# Patient Record
Sex: Female | Born: 1965 | Hispanic: Yes | Marital: Married | Smoking: Never smoker
Health system: Southern US, Community
[De-identification: ages and names within clinical notes are randomized; demographics above are authoritative.]

## PROBLEM LIST (undated history)

## (undated) DIAGNOSIS — I10 Essential (primary) hypertension: Secondary | ICD-10-CM

## (undated) DIAGNOSIS — E119 Type 2 diabetes mellitus without complications: Secondary | ICD-10-CM

---

## 2012-12-14 ENCOUNTER — Emergency Department: Payer: Self-pay | Admitting: Emergency Medicine

## 2012-12-16 ENCOUNTER — Emergency Department: Payer: Self-pay | Admitting: Emergency Medicine

## 2012-12-16 LAB — CBC
HCT: 38.8 % (ref 35.0–47.0)
HGB: 13.5 g/dL (ref 12.0–16.0)
MCH: 31.1 pg (ref 26.0–34.0)
MCV: 89 fL (ref 80–100)
RBC: 4.35 10*6/uL (ref 3.80–5.20)
RDW: 12.3 % (ref 11.5–14.5)
WBC: 8.2 10*3/uL (ref 3.6–11.0)

## 2013-02-06 ENCOUNTER — Ambulatory Visit: Payer: Self-pay

## 2013-03-06 ENCOUNTER — Ambulatory Visit: Payer: Self-pay

## 2015-01-15 ENCOUNTER — Emergency Department: Payer: Self-pay | Admitting: Emergency Medicine

## 2017-01-18 ENCOUNTER — Ambulatory Visit: Payer: Self-pay | Attending: Oncology

## 2017-12-28 ENCOUNTER — Ambulatory Visit: Admission: RE | Admit: 2017-12-28 | Payer: Medicaid Other | Source: Ambulatory Visit | Admitting: *Deleted

## 2018-01-03 ENCOUNTER — Ambulatory Visit: Payer: Self-pay

## 2018-01-03 ENCOUNTER — Ambulatory Visit
Admission: RE | Admit: 2018-01-03 | Discharge: 2018-01-03 | Disposition: A | Discharge status: Home / Self Care | Payer: Self-pay | Source: Ambulatory Visit | Attending: Oncology | Admitting: Oncology

## 2018-01-03 ENCOUNTER — Ambulatory Visit: Payer: Medicaid Other | Attending: Oncology | Admitting: *Deleted

## 2018-01-03 VITALS — BP 145/91 | HR 94 | Temp 98.1°F | Ht 61.0 in | Wt 142.0 lb

## 2018-01-03 DIAGNOSIS — Z0000 Encounter for general adult medical examination without abnormal findings: Secondary | ICD-10-CM

## 2018-01-03 NOTE — Progress Notes (Signed)
Subjective:     Patient ID: Amanda RungAna Del Carmen Savage, female   DOB: 02/25/1966, 52 y.o.   MRN: [id number]  HPI   Review of Systems     Objective:   Physical Exam  Pulmonary/Chest: Right breast exhibits no inverted nipple, no mass, no nipple discharge, no skin change and no tenderness. Left breast exhibits no inverted nipple, no mass, no nipple discharge, no skin change and no tenderness. Breasts are symmetrical.       Assessment:     52 year old Hispanic female referred to [facility] by [facility] for clinical breast exam and mammogram only.  Amanda Savage, the interpreter present during the interview and exam.  Patient also has her daughter present.  Clinical breast exam unremarkable.  Taught self breast awareness.  Last pap on 10/26/17 was negative without HPV co-testing.  Next pap due in 2023.  Patient has been screened for eligibility.  She does not have any insurance, Medicare or regular Medicaid.  She does have family planning Medicaid.  She also meets financial eligibility.  Hand-out given on the Affordable Care Act.    Plan:     Screening mammogram ordered.  Will follow-up per [facility] protocol.

## 2018-01-03 NOTE — Patient Instructions (Signed)
Gave patient hand-out, Women Staying Healthy, Active and Well from [facility], with education on breast health, pap smears, heart and colon health. 

## 2018-01-15 ENCOUNTER — Encounter: Payer: Self-pay | Admitting: *Deleted

## 2018-01-15 NOTE — Progress Notes (Signed)
Letter mailed from the [facility] to inform patient of her normal mammogram results.  Patient is to follow-up with annual screening in one year.  HSIS to Christy. 

## 2020-01-18 ENCOUNTER — Ambulatory Visit: Payer: Self-pay | Attending: Internal Medicine

## 2020-01-18 DIAGNOSIS — Z23 Encounter for immunization: Secondary | ICD-10-CM

## 2020-01-18 NOTE — Progress Notes (Signed)
   Covid-19 Vaccination Clinic  Name:  Magali Bray    MRN: [id number] DOB: April 10, 1966  01/18/2020  Ms. Coombs was observed post Covid-19 immunization for 15 minutes without incident. She was provided with Vaccine Information Sheet and instruction to access the V-Safe system.   Ms. Buffalo was instructed to call 911 with any severe reactions post vaccine: Marland Kitchen Difficulty breathing  . Swelling of face and throat  . A fast heartbeat  . A bad rash all over body  . Dizziness and weakness   Immunizations Administered    Name Date Dose VIS Date Route   Pfizer COVID-19 Vaccine 01/18/2020 12:56 PM 0.3 mL 10/11/2019 Intramuscular   Manufacturer: [employer], [employer]   Lot: [id number]   NDC: [address]-4

## 2020-02-08 ENCOUNTER — Ambulatory Visit: Payer: Self-pay | Attending: Internal Medicine

## 2020-02-08 DIAGNOSIS — Z23 Encounter for immunization: Secondary | ICD-10-CM

## 2020-02-08 NOTE — Progress Notes (Signed)
   Covid-19 Vaccination Clinic  Name:  Amanda Savage    MRN: [id number] DOB: [birth year]  02/08/2020  [name] was observed post Covid-19 immunization for 15 minutes without incident. She was provided with Vaccine Information Sheet and instruction to access the V-Safe system.   [name] was instructed to call 911 with any severe reactions post vaccine: Marland Kitchen Difficulty breathing  . Swelling of face and throat  . A fast heartbeat  . A bad rash all over body  . Dizziness and weakness   Immunizations Administered    Name Date Dose VIS Date Route   Pfizer COVID-19 Vaccine 02/08/2020 12:07 PM 0.3 mL 10/11/2019 Intramuscular   Manufacturer: [employer], [employer]   Lot: [id number]   NDC: [address]-2

## 2020-08-19 ENCOUNTER — Ambulatory Visit (LOCAL_COMMUNITY_HEALTH_CENTER): Payer: Self-pay

## 2020-08-19 ENCOUNTER — Other Ambulatory Visit: Payer: Self-pay

## 2020-08-19 DIAGNOSIS — Z23 Encounter for immunization: Secondary | ICD-10-CM

## 2020-08-19 NOTE — Progress Notes (Signed)
Pt to clinic for vaccines, accepted Tdap, influenza, MMR, and Twinrix today. Pt to check with her PCP or immigration doctor about Varicella vaccine and taking ASA. Pt counseled that if Varicella vaccine is required or recommended by doctor, then she must wait at least 4 weeks before getting the vaccine since she had MMR today.

## 2021-12-13 ENCOUNTER — Emergency Department
Admission: EM | Admit: 2021-12-13 | Discharge: 2021-12-13 | Disposition: A | Discharge status: Home / Self Care | Payer: Self-pay | Attending: Emergency Medicine | Admitting: Emergency Medicine

## 2021-12-13 ENCOUNTER — Other Ambulatory Visit: Payer: Self-pay

## 2021-12-13 DIAGNOSIS — I10 Essential (primary) hypertension: Secondary | ICD-10-CM | POA: Insufficient documentation

## 2021-12-13 DIAGNOSIS — E119 Type 2 diabetes mellitus without complications: Secondary | ICD-10-CM | POA: Insufficient documentation

## 2021-12-13 DIAGNOSIS — R439 Unspecified disturbances of smell and taste: Secondary | ICD-10-CM | POA: Insufficient documentation

## 2021-12-13 DIAGNOSIS — R432 Parageusia: Secondary | ICD-10-CM

## 2021-12-13 HISTORY — DX: Essential (primary) hypertension: I10

## 2021-12-13 HISTORY — DX: Type 2 diabetes mellitus without complications: E11.9

## 2021-12-13 LAB — URINALYSIS, COMPLETE (UACMP) WITH MICROSCOPIC
Bilirubin Urine: NEGATIVE
Glucose, UA: 150 mg/dL — AB
Hgb urine dipstick: NEGATIVE
Ketones, ur: NEGATIVE mg/dL
Nitrite: NEGATIVE
Protein, ur: NEGATIVE mg/dL
Specific Gravity, Urine: 1.004 — ABNORMAL LOW (ref 1.005–1.030)
pH: 6 (ref 5.0–8.0)

## 2021-12-13 LAB — BASIC METABOLIC PANEL
Anion gap: 10 (ref 5–15)
BUN: 16 mg/dL (ref 6–20)
CO2: 25 mmol/L (ref 22–32)
Calcium: 9.5 mg/dL (ref 8.9–10.3)
Chloride: 96 mmol/L — ABNORMAL LOW (ref 98–111)
Creatinine, Ser: 0.34 mg/dL — ABNORMAL LOW (ref 0.44–1.00)
GFR, Estimated: >60 mL/min (ref >60)
Glucose, Bld: 292 mg/dL — ABNORMAL HIGH (ref 70–99)
Potassium: 3.6 mmol/L (ref 3.5–5.1)
Sodium: 131 mmol/L — ABNORMAL LOW (ref 135–145)

## 2021-12-13 LAB — CBC
HCT: 39.2 % (ref 36.0–46.0)
Hemoglobin: 13.5 g/dL (ref 12.0–15.0)
MCH: 30.7 pg (ref 26.0–34.0)
MCHC: 34.4 g/dL (ref 30.0–36.0)
MCV: 89.1 fL (ref 80.0–100.0)
Platelets: 349 10*3/uL (ref 150–400)
RBC: 4.4 MIL/uL (ref 3.87–5.11)
RDW: 11.8 % (ref 11.5–15.5)
WBC: 7 10*3/uL (ref 4.0–10.5)
nRBC: 0 % (ref 0.0–0.2)

## 2021-12-13 LAB — CBG MONITORING, ED: Glucose-Capillary: 263 mg/dL — ABNORMAL HIGH (ref 70–99)

## 2021-12-13 NOTE — Discharge Instructions (Signed)
I am not exactly sure why you are is altered.  Please follow-up with your primary care provider.  If you develop any new neurologic symptoms such as difficulty seeing, numbness or weakness, please return to the emergency department.

## 2021-12-13 NOTE — ED Provider Notes (Signed)
[facility] Provider Note    Event Date/Time   First MD Initiated Contact with Patient 12/13/21 1708     (approximate)   History   altered taste   HPI  Amanda Savage is a 56 y.o. female with past medical history of diabetes and hypertension who presents with altered taste.  Patient notes that for the last week everything that she eats taste sweet including water.  She tried to see her primary care doctor today but was unable to get in.  She denies any headache visual change difficulty speaking numbness tingling or weakness.  No history of similar.  Sense of smell is normal.  No congestion fevers.    Past Medical History:  Diagnosis Date   Diabetes mellitus without complication (HCC)    Hypertension     There are no problems to display for this patient.    Physical Exam  Triage Vital Signs: ED Triage Vitals  Enc Vitals Group     BP 12/13/21 1641 (!) 145/104     Pulse Rate 12/13/21 1641 96     Resp 12/13/21 1641 20     Temp 12/13/21 1641 98.2 F (36.8 C)     Temp Source 12/13/21 1641 Oral     SpO2 12/13/21 1641 98 %     Weight 12/13/21 1652 170 lb (77.1 kg)     Height 12/13/21 1652 5\' 1"  (1.549 m)     Head Circumference --      Peak Flow --      Pain Score 12/13/21 1652 0     Pain Loc --      Pain Edu? --      Excl. in GC? --     Most recent vital signs: Vitals:   12/13/21 1641  BP: (!) 145/104  Pulse: 96  Resp: 20  Temp: 98.2 F (36.8 C)  SpO2: 98%     General: Awake, no distress.  CV:  Good peripheral perfusion.  Resp:  Normal effort.  Abd:  No distention.  Neuro:             Awake, Alert, Oriented x 3  Other:  Aox3, nml speech  PERRL, EOMI, face symmetric, nml tongue movement  5/5 strength in the BL upper and lower extremities  Sensation grossly intact in the BL upper and lower extremities  Finger-nose-finger intact BL    ED Results / Procedures / Treatments  Labs (all labs ordered are listed, but only  abnormal results are displayed) Labs Reviewed  BASIC METABOLIC PANEL - Abnormal; Notable for the following components:      Result Value   Sodium 131 (*)    Chloride 96 (*)    Glucose, Bld 292 (*)    Creatinine, Ser 0.34 (*)    All other components within normal limits  URINALYSIS, COMPLETE (UACMP) WITH MICROSCOPIC - Abnormal; Notable for the following components:   Color, Urine COLORLESS (*)    APPearance CLEAR (*)    Specific Gravity, Urine 1.004 (*)    Glucose, UA 150 (*)    Leukocytes,Ua SMALL (*)    Bacteria, UA RARE (*)    All other components within normal limits  CBG MONITORING, ED - Abnormal; Notable for the following components:   Glucose-Capillary 263 (*)    All other components within normal limits  CBC     EKG     RADIOLOGY    PROCEDURES:    MEDICATIONS ORDERED IN ED: Medications - No data  to display   IMPRESSION / MDM / ASSESSMENT AND PLAN / ED COURSE  I reviewed the triage vital signs and the nursing notes.                              Patient is a 56 year old female who presents with altered taste.  For the past week everything she eats including water taste sweet.  She has no other associated symptoms.  On exam she appears well.  Labs are notable for mild hyperglycemia but no signs of DKA.  No abnormal findings in the oropharynx and she has no abnormalities on neurologic exam.  Overall unclear what is causing the symptoms.  I am not concerned for acute CVA or other acute neurologic process given she has no abnormalities on neurologic exam.  Encouraged her to follow-up with her primary care provider.  No additional work-up needed today.  Clinical Course as of 12/13/21 1728  Mon Dec 13, 2021  1725 Glucose(!): 292 [KM]    Clinical Course User Index [KM] Georga Hacking, MD     FINAL CLINICAL IMPRESSION(S) / ED DIAGNOSES   Final diagnoses:  Taste sense altered     Rx / DC Orders   ED Discharge Orders     None        Note:  This  document was prepared using Dragon voice recognition software and may include unintentional dictation errors.   Georga Hacking, MD 12/13/21 1728

## 2021-12-13 NOTE — ED Triage Notes (Signed)
Pt to ED for "sweet taste" for a week. States even drinking water tastes sweet. Cbg >270 in triage, pt reports this is normal for her. Takes insulin injections and pills for diabetes. Denies pain, weakness Pt tearful in triage over situation.

## 2021-12-13 NOTE — ED Provider Triage Note (Signed)
Emergency Medicine Provider Triage Evaluation Note   History limited by Spanish language.  Interpreter present for evaluation in triage.  [address] [place], a 56 y.o. female  was evaluated in triage.  Pt complains of a 1 week complaint of a "sweet taste" in her mouth.  Patient reports that even drinking water tastes weak to her.  She has a history of diabetes managed with both insulin and pills.  She notes a CBG of greater than 400 at home prior to arrival.  Patient denies any recent illness, fevers, chills, or sweats.  She also denies any nausea, vomiting, or diarrhea.  She does not endorse some increase thirst and urination.  Review of Systems  Positive: Dysgeusia, hyperglycemia Negative: FCS, NVD  Physical Exam  BP (!) 145/104    Pulse 96    Temp 98.2 F (36.8 C) (Oral)    Resp 20    Ht 5\' 1"  (1.549 m)    Wt 77.1 kg    SpO2 98%    BMI 32.12 kg/m  Gen:   Awake, no distress  Tearful, but denies pain Resp:  Normal effort  MSK:   Moves extremities without difficulty  Other:  CVS RRR  Medical Decision Making  Medically screening exam initiated at 5:03 PM.  Appropriate orders placed.  Sheyenne Del Peggie Hornak was informed that the remainder of the evaluation will be completed by another provider, this initial triage assessment does not replace that evaluation, and the importance of remaining in the ED until their evaluation is complete.  Patient with diabetes and hypertension, presents to the ED for evaluation of sweet taste in her mouth last week.  Patient also notes elevated blood sugars today.   Ma Hillock, PA-C 12/13/21 1705

## 2022-03-16 ENCOUNTER — Other Ambulatory Visit: Payer: Self-pay

## 2022-03-16 DIAGNOSIS — Z1211 Encounter for screening for malignant neoplasm of colon: Secondary | ICD-10-CM

## 2022-03-16 DIAGNOSIS — Z1231 Encounter for screening mammogram for malignant neoplasm of breast: Secondary | ICD-10-CM

## 2022-03-22 ENCOUNTER — Ambulatory Visit: Payer: Self-pay | Attending: Hematology and Oncology | Admitting: *Deleted

## 2022-03-22 ENCOUNTER — Ambulatory Visit
Admission: RE | Admit: 2022-03-22 | Discharge: 2022-03-22 | Disposition: A | Discharge status: Home / Self Care | Payer: Self-pay | Source: Ambulatory Visit | Attending: Obstetrics and Gynecology | Admitting: Obstetrics and Gynecology

## 2022-03-22 VITALS — BP 105/72 | HR 95 | Wt 169.0 lb

## 2022-03-22 DIAGNOSIS — Z01419 Encounter for gynecological examination (general) (routine) without abnormal findings: Secondary | ICD-10-CM

## 2022-03-22 DIAGNOSIS — Z1231 Encounter for screening mammogram for malignant neoplasm of breast: Secondary | ICD-10-CM | POA: Insufficient documentation

## 2022-03-22 NOTE — Progress Notes (Signed)
Ms. Kasia Trego Layla Kesling is a 56 y.o. No obstetric history on file female who presents to [facility] clinic today with no complaints.  Presents for well woman visit.    Pap Smear: Pap smear completed today. Last Pap smear was 10/26/17 at [facility] and was normal with negative findings and no HPV co-testing.  Per patient has no history of an abnormal Pap smear. Last Pap smear result is available in Epic.   Physical exam: Breasts Physical Exam Chest:  Breasts:    Right: No swelling, bleeding, inverted nipple, mass, nipple discharge, skin change or tenderness.     Left: No swelling, bleeding, inverted nipple, mass, nipple discharge, skin change or tenderness.  Abdominal:     Palpations: There is no hepatomegaly or splenomegaly.  Genitourinary:    Exam position: Lithotomy position.     Labia:        Right: No rash, tenderness, lesion or injury.        Left: No rash, tenderness, lesion or injury.      Urethra: No prolapse, urethral pain, urethral swelling or urethral lesion.     Vagina: No signs of injury and foreign body. No vaginal discharge, erythema, tenderness, bleeding, lesions or prolapsed vaginal walls.     Cervix: Erythema present. No cervical motion tenderness, discharge, friability, lesion, cervical bleeding or eversion.     Uterus: Not deviated.      Adnexa:        Right: No mass.         Left: No mass.       Rectum: No mass.     Uterus Uterus is present and nonpalpable. Uterus in normal position and normal size.        Adnexae Bilateral ovaries present. No tenderness on palpation.         Rectovaginal  Rectal exam completed.  No mass palpated.  No skin abnormalities observed on exam.     Smoking History: Patient has never smoked.    Patient Navigation: Patient education provided. Access to services provided for patient through [facility] program. Eddie the AVM interpreter provided. No transportation provided   Colorectal Cancer Screening: Per patient has never had  colonoscopy completed. No complaints today. FIT test given to patient along with instructions.   Breast and Cervical Cancer Risk Assessment: Patient does not have family history of breast cancer, known genetic mutations, or radiation treatment to the chest before age 67. Patient does not have history of cervical dysplasia, immunocompromised, or DES exposure in-utero.  Risk Assessment     Risk Scores       03/22/2022   Last edited by: Lesle Chris, RN   5-year risk: 0.8 %   Lifetime risk: 5.9 %            A: [facility] exam with pap smear   P: Referred patient to the [facility] for a screening mammogram. Appointment scheduled for today.  Jim Like, RN 03/22/2022 2:31 PM

## 2022-03-22 NOTE — Progress Notes (Deleted)
  Subjective:     Patient ID: Amanda Savage, female   DOB: October 23, 1966, 56 y.o.   MRN: [id number]  HPI   Review of Systems     Objective:   Physical Exam Chest:  Breasts:    Right: No swelling, bleeding, inverted nipple, mass, nipple discharge, skin change or tenderness.     Left: No swelling, bleeding, inverted nipple, mass, nipple discharge, skin change or tenderness.  Abdominal:     Palpations: There is no hepatomegaly or splenomegaly.  Genitourinary:    Exam position: Lithotomy position.     Labia:        Right: No rash, tenderness, lesion or injury.        Left: No rash, tenderness, lesion or injury.      Urethra: No prolapse, urethral pain, urethral swelling or urethral lesion.     Vagina: No signs of injury and foreign body. No vaginal discharge, erythema, tenderness, bleeding, lesions or prolapsed vaginal walls.     Cervix: Erythema present. No cervical motion tenderness, discharge, friability, lesion, cervical bleeding or eversion.     Uterus: Not deviated.      Adnexa:        Right: No mass.         Left: No mass.       Rectum: No mass.     Lymphadenopathy:     Upper Body:     Right upper body: No supraclavicular or axillary adenopathy.     Left upper body: No supraclavicular or axillary adenopathy.      Assessment:     ***    Plan:     ***

## 2022-03-24 LAB — CYTOLOGY - PAP
Comment: NEGATIVE
Diagnosis: NEGATIVE
High risk HPV: NEGATIVE

## 2022-03-31 ENCOUNTER — Other Ambulatory Visit: Payer: Self-pay | Admitting: Obstetrics and Gynecology

## 2022-04-04 ENCOUNTER — Telehealth: Payer: Self-pay

## 2022-04-04 LAB — FECAL OCCULT BLOOD, IMMUNOCHEMICAL: Fecal Occult Bld: NEGATIVE

## 2022-04-04 NOTE — Telephone Encounter (Signed)
Via, Delorise Royals, Spanish Interpreter [facility]), Patient informed negative Pap/HPV and FIT test results, next pap due in 5 years. Patient verbalized understanding.

## 2023-09-05 ENCOUNTER — Ambulatory Visit: Payer: Self-pay

## 2023-11-29 ENCOUNTER — Other Ambulatory Visit: Payer: Self-pay | Admitting: Primary Care

## 2023-11-29 DIAGNOSIS — Z1231 Encounter for screening mammogram for malignant neoplasm of breast: Secondary | ICD-10-CM

## 2024-01-10 IMAGING — MG MM DIGITAL SCREENING BILAT W/ TOMO AND CAD
8 series · 9 of 24 positions shown · non-contrast
Comparison: Previous exam(s).

CLINICAL DATA: Screening.

EXAM:
DIGITAL SCREENING BILATERAL MAMMOGRAM WITH TOMOSYNTHESIS AND CAD
TECHNIQUE: Bilateral screening digital craniocaudal and mediolateral oblique
mammograms were obtained. Bilateral screening digital breast
tomosynthesis was performed. The images were evaluated with
computer-aided detection.

[L CC synth-2D]
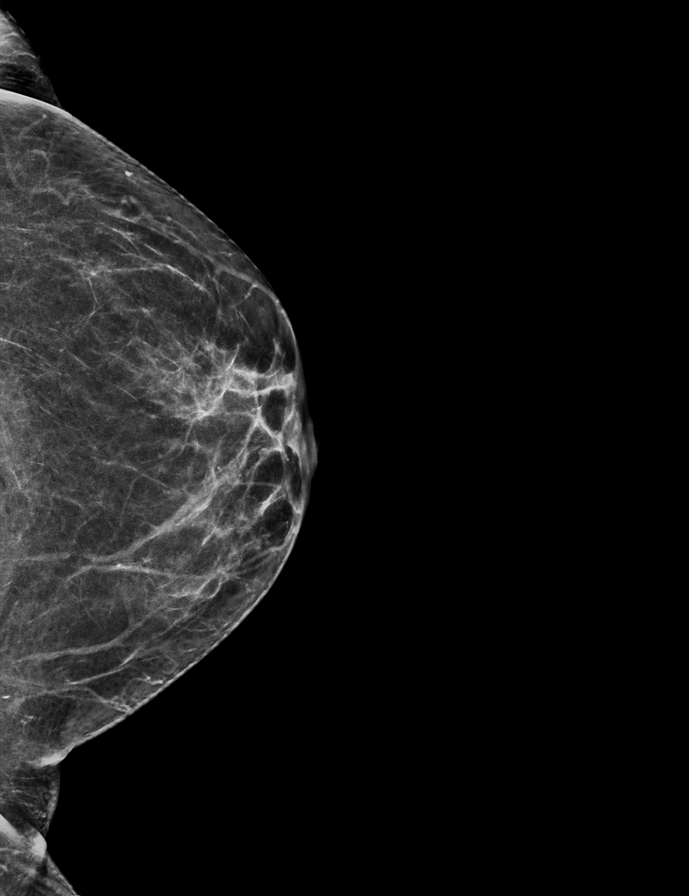

[R MLO synth-2D]
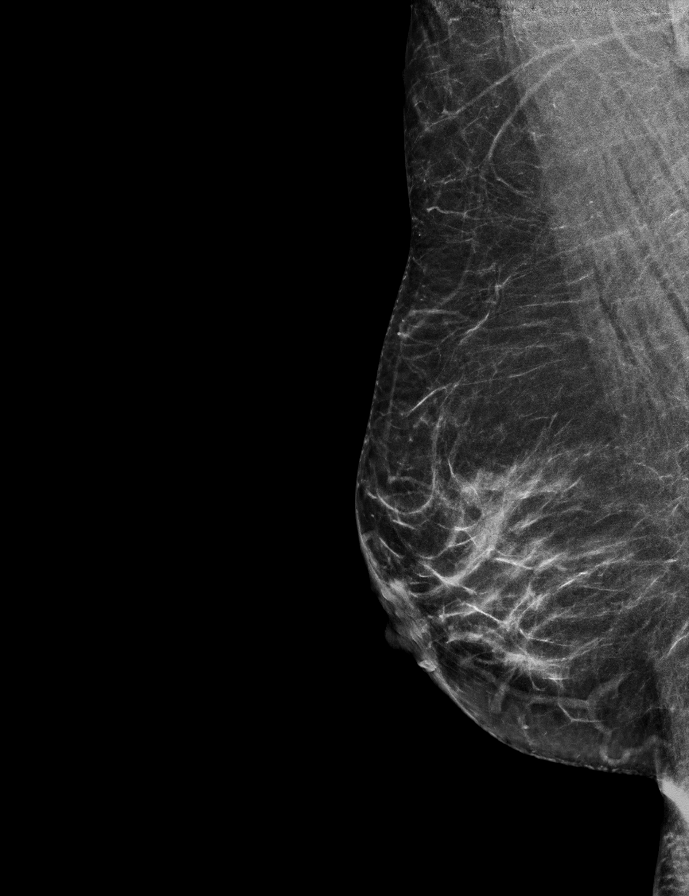

[R CC synth-2D]
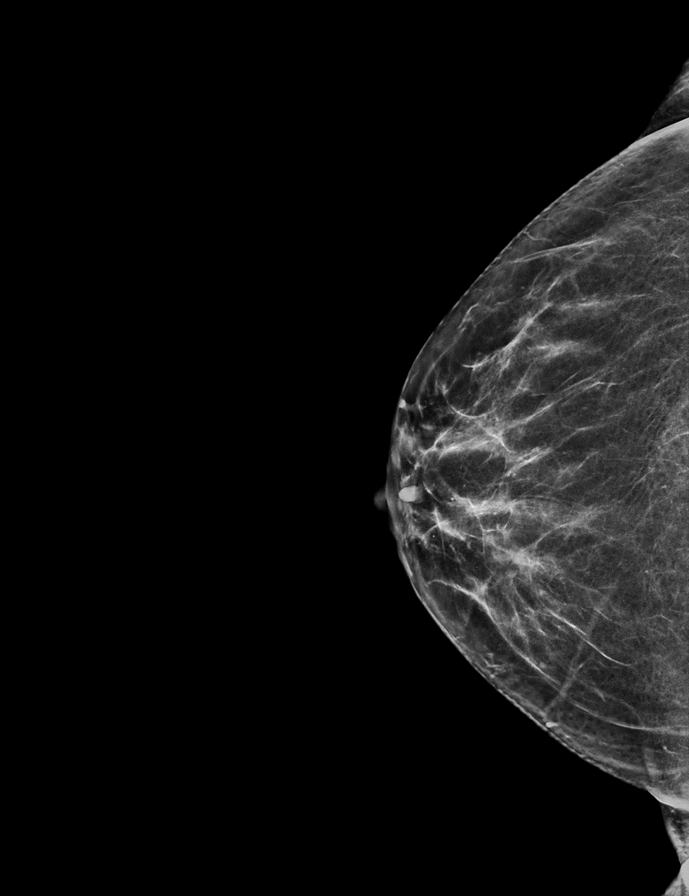

[L MLO synth-2D]
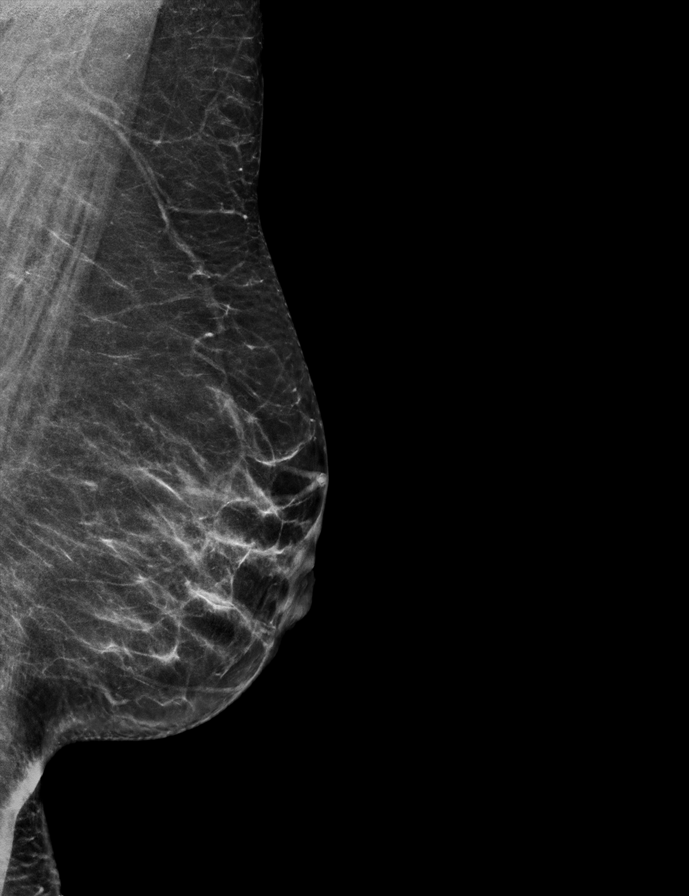

[L CC tomo · 2 of 50 frames shown]
[frame 17/50]
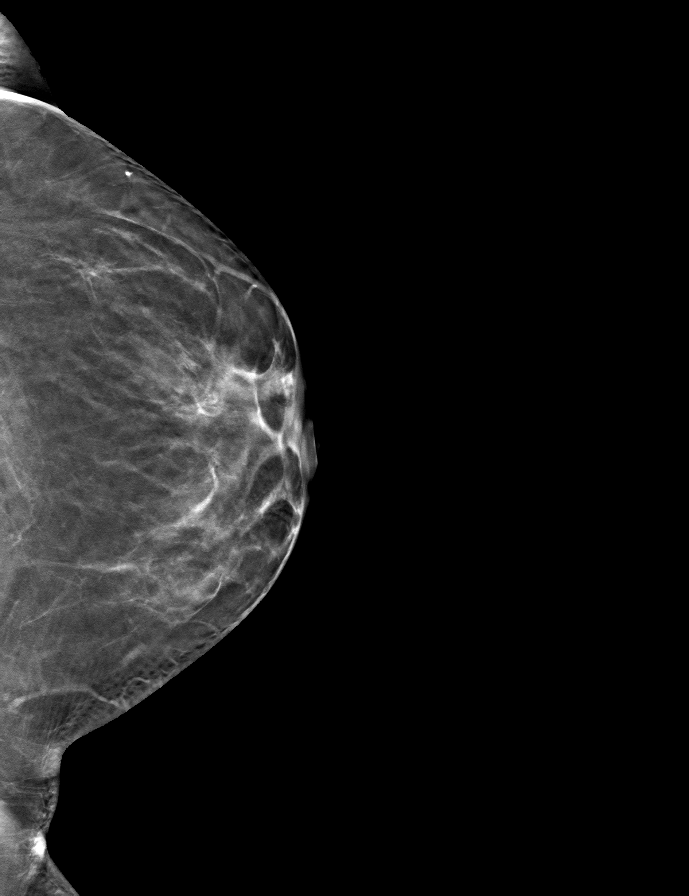
[frame 25/50]
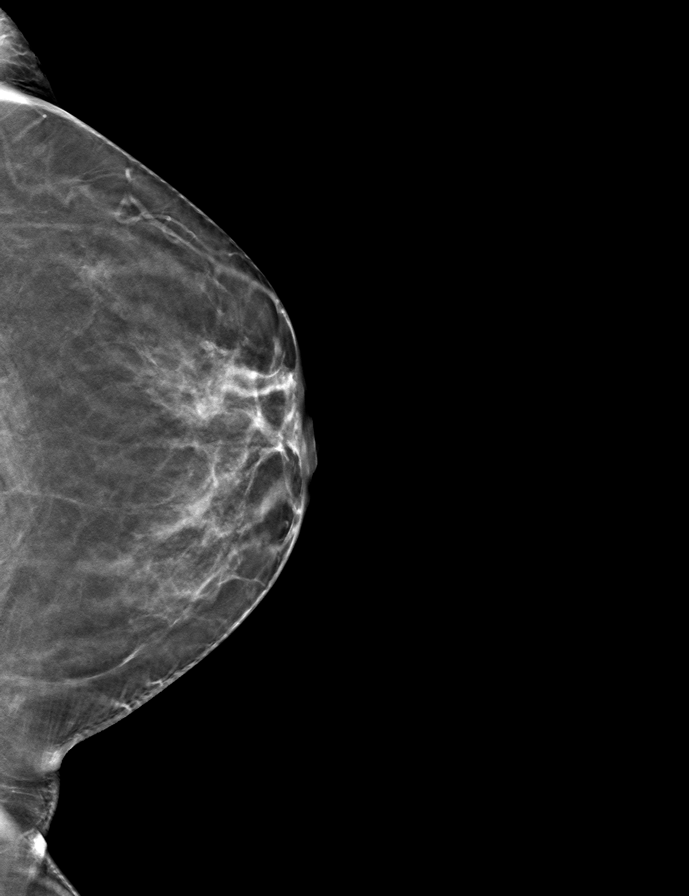

[R MLO tomo · tomo slice 27/54.0]
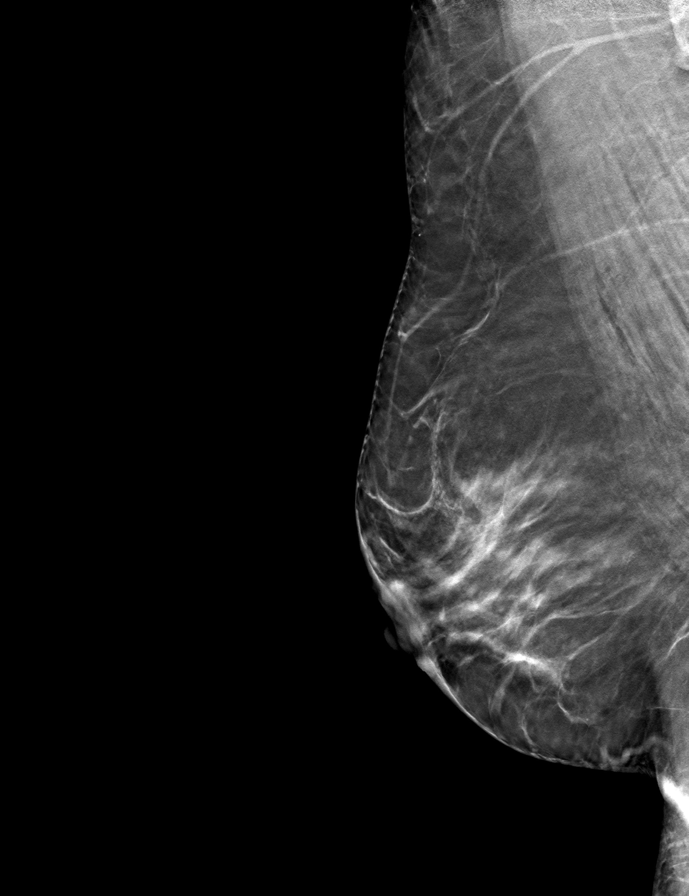

[R CC tomo · tomo slice 25/50.0]
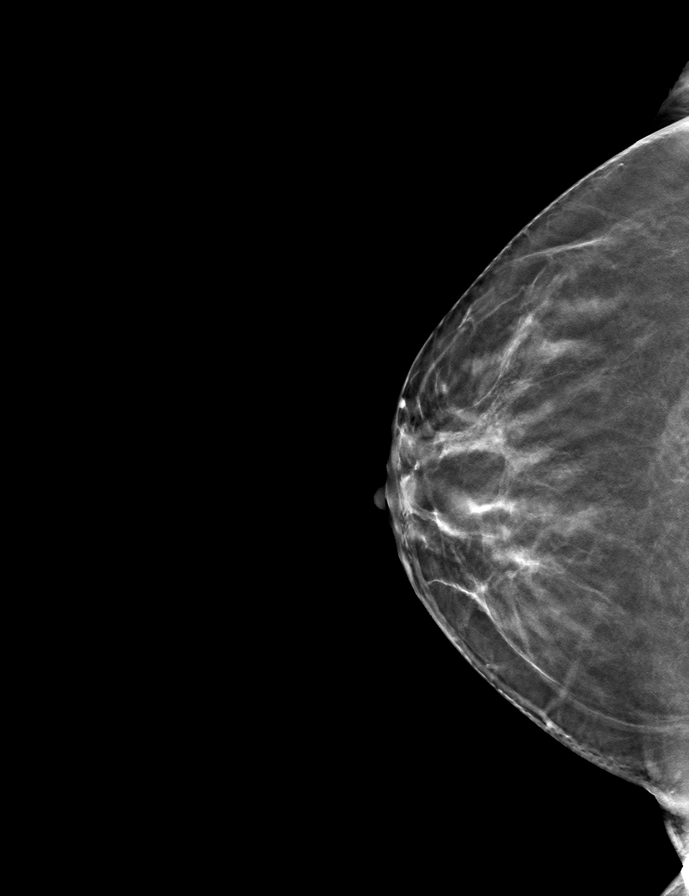

[L MLO tomo · tomo slice 29/56.0]
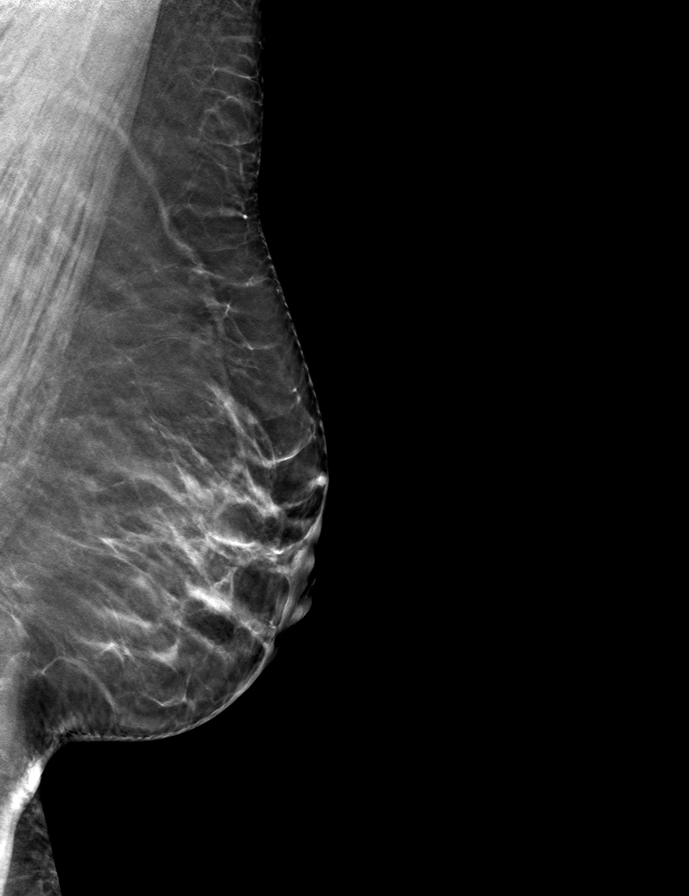

[9 of 24 positions shown; findings below may reference images not displayed]

ACR Breast Density Category b: There are scattered areas of
fibroglandular density.
FINDINGS: There are no findings suspicious for malignancy.
IMPRESSION: No mammographic evidence of malignancy. A result letter of this
screening mammogram will be mailed directly to the patient.

RECOMMENDATION:
Screening mammogram in one year. (Code:51-O-LD2)

BI-RADS CATEGORY  1: Negative.

## 2024-01-24 ENCOUNTER — Telehealth: Payer: Self-pay | Admitting: *Deleted

## 2024-03-20 ENCOUNTER — Ambulatory Visit
Admission: RE | Admit: 2024-03-20 | Discharge: 2024-03-20 | Disposition: A | Discharge status: Home / Self Care | Payer: Self-pay | Source: Ambulatory Visit | Attending: Primary Care | Admitting: Primary Care

## 2024-03-20 DIAGNOSIS — Z1231 Encounter for screening mammogram for malignant neoplasm of breast: Secondary | ICD-10-CM | POA: Insufficient documentation
